# Patient Record
Sex: Female | Born: 1970 | Hispanic: Yes | Marital: Married | Smoking: Never smoker
Health system: Southern US, Community
[De-identification: ages and names within clinical notes are randomized; demographics above are authoritative.]

---

## 2019-10-31 ENCOUNTER — Emergency Department (HOSPITAL_COMMUNITY)
Admission: EM | Admit: 2019-10-31 | Discharge: 2019-10-31 | Disposition: A | Discharge status: Home / Self Care | Payer: No Typology Code available for payment source | Attending: Emergency Medicine | Admitting: Emergency Medicine

## 2019-10-31 ENCOUNTER — Emergency Department (HOSPITAL_COMMUNITY): Payer: No Typology Code available for payment source

## 2019-10-31 ENCOUNTER — Encounter (HOSPITAL_COMMUNITY): Payer: Self-pay | Admitting: Emergency Medicine

## 2019-10-31 DIAGNOSIS — S161XXA Strain of muscle, fascia and tendon at neck level, initial encounter: Secondary | ICD-10-CM | POA: Diagnosis not present

## 2019-10-31 DIAGNOSIS — Y92038 Other place in apartment as the place of occurrence of the external cause: Secondary | ICD-10-CM | POA: Diagnosis not present

## 2019-10-31 DIAGNOSIS — Y9389 Activity, other specified: Secondary | ICD-10-CM | POA: Diagnosis not present

## 2019-10-31 MED ORDER — CYCLOBENZAPRINE HCL 10 MG PO TABS: 10.0000 mg | ORAL_TABLET | Freq: Two times a day (BID) | ORAL | 0 refills | Status: DC | PRN

## 2019-10-31 MED ORDER — ACETAMINOPHEN 500 MG PO TABS: 500.0000 mg | ORAL_TABLET | Freq: Four times a day (QID) | ORAL | 0 refills | Status: DC | PRN
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
## 2020-03-25 ENCOUNTER — Encounter (INDEPENDENT_AMBULATORY_CARE_PROVIDER_SITE_OTHER): Payer: Self-pay | Admitting: Primary Care

## 2020-03-25 ENCOUNTER — Ambulatory Visit (INDEPENDENT_AMBULATORY_CARE_PROVIDER_SITE_OTHER): Payer: 59 | Admitting: Primary Care

## 2020-03-25 ENCOUNTER — Other Ambulatory Visit: Payer: Self-pay

## 2020-03-25 VITALS — BP 124/75 | HR 63 | Temp 97.5°F | Ht 64.5 in | Wt 136.4 lb

## 2020-03-25 DIAGNOSIS — Z131 Encounter for screening for diabetes mellitus: Secondary | ICD-10-CM

## 2020-03-25 DIAGNOSIS — Z7689 Persons encountering health services in other specified circumstances: Secondary | ICD-10-CM

## 2020-03-25 DIAGNOSIS — Z1231 Encounter for screening mammogram for malignant neoplasm of breast: Secondary | ICD-10-CM

## 2020-03-25 DIAGNOSIS — R1084 Generalized abdominal pain: Secondary | ICD-10-CM

## 2020-03-25 DIAGNOSIS — Z23 Encounter for immunization: Secondary | ICD-10-CM

## 2020-03-25 DIAGNOSIS — N939 Abnormal uterine and vaginal bleeding, unspecified: Secondary | ICD-10-CM | POA: Diagnosis not present

## 2020-03-25 LAB — POCT GLYCOSYLATED HEMOGLOBIN (HGB A1C): Hemoglobin A1C: 4.9 % (ref 4.0–5.6)

## 2020-03-25 NOTE — Patient Instructions (Signed)
Plan de alimentacin restringido en grasas y colesterol Fat and Cholesterol Restricted Eating Plan El exceso de grasas y colesterol en la dieta puede causar problemas de salud. Elegir los alimentos adecuados ayuda a mantener normales los niveles de grasas y colesterol. Esto puede evitarle contraer ciertas enfermedades. El mdico puede recomendarle un plan de alimentacin que incluya lo siguiente:  Grasas totales: ______% o menos del total de caloras por da.  Grasas saturadas: ______% o menos del total de caloras por da.  Colesterol: menos de _________mg por da.  Fibra: ______g por da. Consejos para seguir este plan Planificacin de las comidas  En las comidas, divida su plato en cuatro partes iguales: ? Llene la mitad del plato con verduras y ensaladas de hojas verdes. ? Llene un cuarto del plato con cereales integrales. ? Llene un cuarto del plato con alimentos con protenas con bajo contenido de grasas (magras).  Coma pescado con alto contenido de grasas omega3 al menos dos veces por semana. Esas grasas se encuentran en la caballa, el atn, las sardinas y el salmn.  Coma alimentos con alto contenido de fibra, como cereales integrales, frijoles, manzanas, brcoli, zanahorias, guisantes y cebada. Consejos generales  Si necesita adelgazar, consulte a su mdico.  Evite lo siguiente: ? Alimentos con azcar agregada. ? Comidas fritas. ? Alimentos con aceites parcialmente hidrogenados.  Limite el consumo de alcohol a no ms de 1medida por da si es mujer y no est embarazada, y a 2medidas por da si es hombre. Una medida equivale a 12oz de cerveza, 5oz de vino o 1oz de bebidas alcohlicas de alta graduacin.   Lea las etiquetas de los alimentos  Lea las etiquetas de los alimentos para conocer lo siguiente: ? Si contienen grasas trans. ? Si contienen aceites parcialmente hidrogenados. ? La cantidad de grasas saturadas (g) que contiene cada porcin. ? La cantidad de  colesterol (mg) que contiene cada porcin. ? La cantidad de fibra (g) que contiene cada porcin.  Elija alimentos con grasas saludables, tales como las siguientes: ? Grasas monoinsaturadas. ? Grasas poliinsaturadas. ? Grasas omega3.  Elija productos de cereal que tengan cereales integrales. Busque la palabra "integral" en el primer lugar de la lista de ingredientes. Al cocinar  Emplee mtodos de coccin con poca cantidad de grasa. Por ejemplo, hornear, hervir, grillar y asar.  Coma ms comidas caseras. Coma en restaurantes y bares con menos frecuencia.  Evite cocinar usando grasas saturadas, como manteca, crema, aceite de palma, aceite de palmiste y aceite de coco. Alimentos recomendados Frutas  Frutas frescas, en conserva (en su jugo natural) o frutas congeladas. Verduras  Verduras frescas o congeladas (crudas, al vapor, asadas o grilladas). Ensaladas de hojas verdes. Granos  Cereales integrales, como panes, galletas, cereales y pastas de integrales o de trigo integral. Avena sin endulzar, trigo bulgur, cebada, quinua o arroz integral. Tortillas de harina de maz o trigo integral. Carnes y otros alimentos ricos en protenas  Carne de res molida (al 85% o ms magra), carne de res de animales alimentados con pastos o carne de res sin la grasa. Pollo o pavo sin piel. Carne de pollo o de pavo molida. Cerdo sin la grasa. Todos los pescados y frutos de mar. Claras de huevo. Porotos, guisantes o lentejas secos. Frutos secos o semillas sin sal. Frijoles enlatados sin sal. Mantequillas de frutos secos sin azcar ni aceite agregados. Lcteos  Productos lcteos descremados o semidescremados, como leche descremada o al 1%, quesos reducidos en grasas o al 2%, queso cottage o ricota   con bajo contenido de grasas o sin contenido de grasas, o yogur natural descremado o semidescremado. Grasas y aceites  Margarina untable que no contenga grasas trans. Mayonesa y condimentos para ensaladas  livianos o reducidos en grasas. [place]. Aceites de oliva, canola, ssamo o crtamo. Es posible que los productos que se enumeran ms arriba no constituyan una lista completa de los alimentos y las bebidas que puede tomar. Consulte a un nutricionista para obtener ms informacin.   Alimentos a evitar Frutas  Fruta enlatada en almbar espeso. Frutas con salsa de crema o mantequilla. Frutas cocidas en aceite. Verduras  Verduras cocinadas con salsas de queso, crema o mantequilla. Verduras fritas. Granos  Pan blanco. Pastas blancas. Arroz blanco. Pan de maz. Bagels, pasteles y croissants. Galletas saladas y colaciones que contengan grasas trans y aceites hidrogenados. Carnes y otros alimentos ricos en protenas  Cortes de carne con alto contenido de grasa. Costillas, alas de pollo, tocineta, salchicha, mortadela, salame, chinchulines, tocino, perros calientes, salchichas alemanas y embutidos envasados. Hgado y otros rganos. Huevos enteros y yemas de huevo. Pollo y pavo con piel. Carne frita. Lcteos  Leche entera o al 2%, crema, mezcla de leche y crema, y queso crema. Quesos enteros. Yogur entero o endulzado. Quesos con toda su grasa. Cremas no lcteas y coberturas batidas. Quesos procesados, quesos para untar y cuajadas. Bebidas  Alcohol. Bebidas endulzadas con azcar, como refrescos, limonada y bebidas frutales. Grasas y aceites  Mantequilla, margarina en barra, manteca de cerdo, grasa, mantequilla clarificada o grasa de tocino. Aceites de coco, de palmiste y de palma. Dulces y postres  Jarabe de maz, azcares, miel y melazas. Caramelos. Mermeladas y jaleas. Jarabe. Cereales endulzados. Galletas, pasteles, bizcochuelos, donas, muffins y helado. Es posible que los productos que se enumeran ms arriba no constituyan una lista completa de los alimentos y las bebidas que debe evitar. Consulte a un nutricionista para obtener ms informacin. Resumen  Elegir los alimentos adecuados ayuda  a mantener normales los niveles de grasas y colesterol. Esto puede evitarle contraer ciertas enfermedades.  En las comidas, llene la mitad del plato con verduras y ensaladas de hojas verdes.  Coma alimentos con alto contenido de fibras, como cereales integrales, frijoles, manzanas, zanahorias, guisantes y cebada.  Limite los alimentos con azcar agregada y grasas saturadas, el alcohol y las comidas fritas. Esta informacin no tiene como fin reemplazar el consejo del mdico. Asegrese de hacerle al mdico cualquier pregunta que tenga. Document Revised: 07/28/2019 Document Reviewed: 07/28/2019 Elsevier Patient Education  2021 Elsevier Inc.  
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
## 2020-03-26 LAB — CMP14+EGFR
ALT: 14 IU/L (ref 0–32)
AST: 21 IU/L (ref 0–40)
Albumin/Globulin Ratio: 1.5 (ref 1.2–2.2)
Albumin: 4.4 g/dL (ref 3.8–4.8)
Alkaline Phosphatase: 91 IU/L (ref 44–121)
BUN/Creatinine Ratio: 15 (ref 9–23)
BUN: 11 mg/dL (ref 6–24)
Bilirubin Total: 0.3 mg/dL (ref 0.0–1.2)
CO2: 21 mmol/L (ref 20–29)
Calcium: 9.4 mg/dL (ref 8.7–10.2)
Chloride: 104 mmol/L (ref 96–106)
Creatinine, Ser: 0.71 mg/dL (ref 0.57–1.00)
GFR calc Af Amer: 115 mL/min/{1.73_m2} (ref >59)
GFR calc non Af Amer: 100 mL/min/{1.73_m2} (ref >59)
Globulin, Total: 2.9 g/dL (ref 1.5–4.5)
Glucose: 82 mg/dL (ref 65–99)
Potassium: 4.3 mmol/L (ref 3.5–5.2)
Sodium: 140 mmol/L (ref 134–144)
Total Protein: 7.3 g/dL (ref 6.0–8.5)

## 2020-03-26 LAB — CBC WITH DIFFERENTIAL/PLATELET
Basophils Absolute: 0.1 10*3/uL (ref 0.0–0.2)
Basos: 1 %
EOS (ABSOLUTE): 0.2 10*3/uL (ref 0.0–0.4)
Eos: 3 %
Hematocrit: 39.9 % (ref 34.0–46.6)
Hemoglobin: 13.2 g/dL (ref 11.1–15.9)
Immature Grans (Abs): 0 10*3/uL (ref 0.0–0.1)
Immature Granulocytes: 0 %
Lymphocytes Absolute: 2.4 10*3/uL (ref 0.7–3.1)
Lymphs: 36 %
MCH: 28.3 pg (ref 26.6–33.0)
MCHC: 33.1 g/dL (ref 31.5–35.7)
MCV: 86 fL (ref 79–97)
Monocytes Absolute: 0.6 10*3/uL (ref 0.1–0.9)
Monocytes: 9 %
Neutrophils Absolute: 3.3 10*3/uL (ref 1.4–7.0)
Neutrophils: 51 %
Platelets: 217 10*3/uL (ref 150–450)
RBC: 4.66 x10E6/uL (ref 3.77–5.28)
RDW: 12.7 % (ref 11.7–15.4)
WBC: 6.7 10*3/uL (ref 3.4–10.8)

## 2020-04-09 ENCOUNTER — Other Ambulatory Visit (HOSPITAL_COMMUNITY)
Admission: RE | Admit: 2020-04-09 | Discharge: 2020-04-09 | Disposition: A | Discharge status: Home / Self Care | Payer: 59 | Source: Ambulatory Visit | Attending: Primary Care | Admitting: Primary Care

## 2020-04-09 ENCOUNTER — Ambulatory Visit (INDEPENDENT_AMBULATORY_CARE_PROVIDER_SITE_OTHER): Payer: 59 | Admitting: Primary Care

## 2020-04-09 ENCOUNTER — Other Ambulatory Visit: Payer: Self-pay

## 2020-04-09 ENCOUNTER — Encounter (INDEPENDENT_AMBULATORY_CARE_PROVIDER_SITE_OTHER): Payer: Self-pay | Admitting: Primary Care

## 2020-04-09 VITALS — BP 114/77 | HR 76 | Temp 97.5°F | Ht 64.5 in | Wt 134.2 lb

## 2020-04-09 DIAGNOSIS — Z124 Encounter for screening for malignant neoplasm of cervix: Secondary | ICD-10-CM | POA: Diagnosis present

## 2020-04-09 DIAGNOSIS — N898 Other specified noninflammatory disorders of vagina: Secondary | ICD-10-CM

## 2020-04-09 DIAGNOSIS — Z1211 Encounter for screening for malignant neoplasm of colon: Secondary | ICD-10-CM | POA: Diagnosis not present

## 2020-04-09 DIAGNOSIS — M542 Cervicalgia: Secondary | ICD-10-CM | POA: Diagnosis not present
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
## 2020-04-10 LAB — CYTOLOGY - PAP: Diagnosis: NEGATIVE

## 2020-04-10 LAB — CERVICOVAGINAL ANCILLARY ONLY
Bacterial Vaginitis (gardnerella): POSITIVE — AB
Candida Glabrata: NEGATIVE
Candida Vaginitis: NEGATIVE
Chlamydia: NEGATIVE
Comment: NEGATIVE
Comment: NEGATIVE
Comment: NEGATIVE
Comment: NEGATIVE
Comment: NEGATIVE
Comment: NORMAL
Neisseria Gonorrhea: NEGATIVE
Trichomonas: NEGATIVE

## 2020-04-12 ENCOUNTER — Other Ambulatory Visit (INDEPENDENT_AMBULATORY_CARE_PROVIDER_SITE_OTHER): Payer: Self-pay | Admitting: Primary Care

## 2020-04-12 ENCOUNTER — Telehealth (INDEPENDENT_AMBULATORY_CARE_PROVIDER_SITE_OTHER): Payer: Self-pay

## 2020-04-12 DIAGNOSIS — B9689 Other specified bacterial agents as the cause of diseases classified elsewhere: Secondary | ICD-10-CM

## 2020-04-12 DIAGNOSIS — N760 Acute vaginitis: Secondary | ICD-10-CM

## 2020-04-12 MED ORDER — METRONIDAZOLE 500 MG PO TABS: 500.0000 mg | ORAL_TABLET | Freq: Two times a day (BID) | ORAL | 0 refills | Status: DC

## 2020-04-12 NOTE — Telephone Encounter (Signed)
-----   Message from Grayce Sessions, NP sent at 04/12/2020  9:43 AM EST ----- You have bacterial vaginosis. A prescription for metronidazole. You take this medication twice a day for 7 days. Be sure that you do not drink alcohol when you take this medication because the combination can give you severe nausea and vomiting.

## 2020-04-12 NOTE — Telephone Encounter (Signed)
Call placed to patient using pacific interpreter [phone]) patient is aware of normal pap. STD screening negative. Positive for BV. Patient will come by office to pick up Rx for antibiotic. Maryjean Morn, CMA

## 2020-04-17 ENCOUNTER — Ambulatory Visit (INDEPENDENT_AMBULATORY_CARE_PROVIDER_SITE_OTHER): Payer: 59 | Admitting: Family Medicine

## 2020-04-17 ENCOUNTER — Encounter: Payer: Self-pay | Admitting: Family Medicine

## 2020-04-17 ENCOUNTER — Other Ambulatory Visit: Payer: Self-pay

## 2020-04-17 DIAGNOSIS — M542 Cervicalgia: Secondary | ICD-10-CM | POA: Diagnosis not present

## 2020-04-17 MED ORDER — GABAPENTIN 100 MG PO CAPS: ORAL_CAPSULE | ORAL | 3 refills | Status: AC
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
## 2020-05-15 ENCOUNTER — Ambulatory Visit (INDEPENDENT_AMBULATORY_CARE_PROVIDER_SITE_OTHER): Payer: 59 | Admitting: Family Medicine

## 2020-05-15 ENCOUNTER — Telehealth: Payer: Self-pay | Admitting: Family Medicine

## 2020-05-15 ENCOUNTER — Other Ambulatory Visit: Payer: Self-pay

## 2020-05-15 ENCOUNTER — Encounter: Payer: Self-pay | Admitting: Family Medicine

## 2020-05-15 DIAGNOSIS — H5500 Unspecified nystagmus: Secondary | ICD-10-CM

## 2020-05-15 DIAGNOSIS — G44329 Chronic post-traumatic headache, not intractable: Secondary | ICD-10-CM | POA: Diagnosis not present

## 2020-05-15 DIAGNOSIS — M542 Cervicalgia: Secondary | ICD-10-CM | POA: Diagnosis not present

## 2020-05-15 NOTE — Telephone Encounter (Signed)
Brain MRI has been ordered (discussed at visit).

## 2020-05-15 NOTE — Telephone Encounter (Signed)
The patient has been advised. Her eye doctor is in [place]. She does not have one here.
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
## 2020-05-15 NOTE — Telephone Encounter (Signed)
I'd suggest seeing an eye doctor first.

## 2020-05-15 NOTE — Telephone Encounter (Signed)
Pt was seen at [facility] and he states this pt has had some changes on left eye with tracking.she was hoping you could take a look at it today when she is suppose to be seen.

## 2020-05-17 ENCOUNTER — Other Ambulatory Visit: Payer: Self-pay

## 2020-05-17 ENCOUNTER — Ambulatory Visit
Admission: RE | Admit: 2020-05-17 | Discharge: 2020-05-17 | Disposition: A | Discharge status: Home / Self Care | Payer: 59 | Source: Ambulatory Visit | Attending: Primary Care | Admitting: Primary Care

## 2020-05-17 DIAGNOSIS — Z1231 Encounter for screening mammogram for malignant neoplasm of breast: Secondary | ICD-10-CM

## 2020-05-25 ENCOUNTER — Ambulatory Visit
Admission: RE | Admit: 2020-05-25 | Discharge: 2020-05-25 | Disposition: A | Discharge status: Home / Self Care | Payer: 59 | Source: Ambulatory Visit | Attending: Family Medicine | Admitting: Family Medicine

## 2020-05-25 ENCOUNTER — Other Ambulatory Visit: Payer: Self-pay

## 2020-05-25 DIAGNOSIS — G44329 Chronic post-traumatic headache, not intractable: Secondary | ICD-10-CM | POA: Diagnosis present

## 2020-05-25 DIAGNOSIS — M542 Cervicalgia: Secondary | ICD-10-CM | POA: Insufficient documentation

## 2020-05-26 ENCOUNTER — Telehealth: Payer: Self-pay | Admitting: Family Medicine

## 2020-05-26 DIAGNOSIS — M542 Cervicalgia: Secondary | ICD-10-CM

## 2020-05-26 NOTE — Telephone Encounter (Signed)
MRI shows very mild bulging of a few discs which is essentially normal for a 50 year old.  No nerve impingement, no indication for surgery.

## 2020-05-26 NOTE — Telephone Encounter (Signed)
Brain MRI looks normal.   

## 2020-05-27 NOTE — Telephone Encounter (Signed)
Referral ordered

## 2020-05-27 NOTE — Addendum Note (Signed)
Addended by: Lillia Carmel on: 05/27/2020 04:02 PM   Modules accepted: Orders

## 2020-05-27 NOTE — Telephone Encounter (Signed)
Patient aware.

## 2020-05-27 NOTE — Telephone Encounter (Signed)
Called patient to advise on both MRI's. She is aware and would like to proceed with referral to Dr. Alvester Morin for Consideration of facet joint injs

## 2020-05-31 ENCOUNTER — Telehealth: Payer: Self-pay | Admitting: Physical Medicine and Rehabilitation

## 2020-05-31 NOTE — Telephone Encounter (Signed)
Patient's husband Jesus returning call to [place]. Please back at [phone].

## 2020-06-03 NOTE — Telephone Encounter (Signed)
Called pt and r/s 

## 2020-06-04 ENCOUNTER — Encounter: Payer: Self-pay | Admitting: Physical Medicine and Rehabilitation

## 2020-06-04 ENCOUNTER — Ambulatory Visit (INDEPENDENT_AMBULATORY_CARE_PROVIDER_SITE_OTHER): Payer: 59 | Admitting: Physical Medicine and Rehabilitation

## 2020-06-04 ENCOUNTER — Ambulatory Visit: Payer: Self-pay

## 2020-06-04 ENCOUNTER — Other Ambulatory Visit: Payer: Self-pay

## 2020-06-04 VITALS — BP 103/72 | HR 61

## 2020-06-04 DIAGNOSIS — M47812 Spondylosis without myelopathy or radiculopathy, cervical region: Secondary | ICD-10-CM

## 2020-06-04 DIAGNOSIS — R202 Paresthesia of skin: Secondary | ICD-10-CM

## 2020-06-04 DIAGNOSIS — M542 Cervicalgia: Secondary | ICD-10-CM

## 2020-06-04 DIAGNOSIS — M7918 Myalgia, other site: Secondary | ICD-10-CM | POA: Diagnosis not present

## 2020-06-04 MED ORDER — BETAMETHASONE SOD PHOS & ACET 6 (3-3) MG/ML IJ SUSP
12.0000 mg | Freq: Once | INTRAMUSCULAR | Status: AC
  Administered 2020-06-04: 12 mg

## 2020-06-04 NOTE — Progress Notes (Signed)
Pt state neck pain that travels down the left arm. Pt state standing and walking for a long period of time makes the pain worse. Pt state she use heating pads and massage to help ease her pain.  Numeric Pain Rating Scale and Functional Assessment Average Pain 8   In the last MONTH (on 0-10 scale) has pain interfered with the following?  1. General activity like being  able to carry out your everyday physical activities such as walking, climbing stairs, carrying groceries, or moving a chair?  Rating(9)   +Driver, -BT, -Dye Allergies.

## 2020-06-04 NOTE — Patient Instructions (Signed)

## 2020-06-04 NOTE — Procedures (Signed)
Diagnostic Cervical Facet Joint Nerve Block with Fluoroscopic Guidance  Patient: Ashlee Kemp      Date of Birth: 1970/05/11 MRN: [id number] PCP: Grayce Sessions, NP      Visit Date: 06/04/2020   Universal Protocol:    Date/Time: 05/03/223:37 PM  Consent Given By: the patient  Position: LATERAL  Additional Comments: Vital signs were monitored before and after the procedure. Patient was prepped and draped in the usual sterile fashion. The correct patient, procedure, and site was verified.   Injection Procedure Details:   Procedure diagnoses: Cervical spondylosis without myelopathy [M47.812]   Meds Administered:  Meds ordered this encounter  Medications  . betamethasone acetate-betamethasone sodium phosphate (CELESTONE) injection 12 mg     Laterality: Left  Location/Site:  C2-3 C3-4  Needle size: 25 G  Needle type: Standard  Needle Placement: Articular Pillar  Findings:  -Contrast Used: 0.5 mL iohexol 180 mg iodine/mL   -Comments: Excellent flow of contrast across the articular pillars without intravascular flow  Procedure Details: The fluoroscope beam was manipulated to achieve the best "true" lateral view possible by squaring off the endplates with cranial and caudal tilt and using varying obliquity to achieve the a view with the longest length of spinous process.  The region overlying the facet joints mentioned above were then localized under fluoroscopic visualization.  The needle was inserted down to the center of the "trapezoid" outline of the facet joint lateral mass. Bi-planar images were used for confirming placement and spot radiographs were documented.  A 0.25 ml volume of Omnipaque-240 was injected to look for vascular uptake. A 0.5 ml. volume of the anesthetic solution was injected onto the target. This procedure was repeated for each medial branch nerve injected.  Prior to the procedure, the patient was given a Pain Diary which was completed for  baseline measurements.  After the procedure, the patient rated their pain every 30 minutes and will continue rating at this frequency for a total of 5 hours.  The patient has been asked to complete the Diary and return to [place] by mail, fax or hand delivered as soon as possible.   Additional Comments:  The patient tolerated the procedure well Dressing: Band-Aid    Post-procedure details: Patient was observed during the procedure. Post-procedure instructions were reviewed.  Patient left the clinic in stable condition.
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
